# Patient Record
Sex: Male | Born: 2000
Health system: Southern US, Community
[De-identification: ages and names within clinical notes are randomized; demographics above are authoritative.]

## PROBLEM LIST (undated history)

## (undated) DIAGNOSIS — F419 Anxiety disorder, unspecified: Secondary | ICD-10-CM

## (undated) DIAGNOSIS — J45909 Unspecified asthma, uncomplicated: Secondary | ICD-10-CM

## (undated) DIAGNOSIS — M79A21 Nontraumatic compartment syndrome of right lower extremity: Secondary | ICD-10-CM

## (undated) DIAGNOSIS — R011 Cardiac murmur, unspecified: Secondary | ICD-10-CM

## (undated) HISTORY — DX: Nontraumatic compartment syndrome of right lower extremity: M79.A21

## (undated) HISTORY — DX: Unspecified asthma, uncomplicated: J45.909

## (undated) HISTORY — DX: Anxiety disorder, unspecified: F41.9

## (undated) HISTORY — DX: Cardiac murmur, unspecified: R01.1

---
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
## 2022-01-25 ENCOUNTER — Telehealth: Payer: Self-pay | Admitting: Family Medicine

## 2022-01-25 NOTE — Telephone Encounter (Signed)
Received immunization record put in folder

## 2022-01-31 ENCOUNTER — Encounter: Payer: Self-pay | Admitting: Family Medicine

## 2022-01-31 NOTE — Progress Notes (Unsigned)
No chief complaint on file.  Patient is a new patient, here to establish care. Records were received from his pediatrician and reviewed.  Immunizations UTD except did not see any HPV vaccines, or recent influenza or COVID booster. Last TdaP was 08/31/2013  Lipids were done 08/31/2013: TC 166, HDL 61, TG 204 (nonfasting), LDL 64, ratio 2.7.  (glu 119, nonfasting).

## 2022-02-01 ENCOUNTER — Ambulatory Visit: Payer: 59 | Admitting: Family Medicine

## 2022-02-01 ENCOUNTER — Encounter: Payer: Self-pay | Admitting: Family Medicine

## 2022-02-01 VITALS — BP 118/68 | HR 60 | Ht 71.0 in | Wt 161.8 lb

## 2022-02-01 DIAGNOSIS — F419 Anxiety disorder, unspecified: Secondary | ICD-10-CM | POA: Diagnosis not present

## 2022-02-01 DIAGNOSIS — Z77128 Contact with and (suspected) exposure to other hazards in the physical environment: Secondary | ICD-10-CM

## 2022-02-01 DIAGNOSIS — Z7689 Persons encountering health services in other specified circumstances: Secondary | ICD-10-CM

## 2022-02-01 DIAGNOSIS — Z7185 Encounter for immunization safety counseling: Secondary | ICD-10-CM

## 2022-02-01 NOTE — Patient Instructions (Addendum)
We discussed getting the HPV series.  Return for nurse visits when you can for this (series of 3 shots). Not given today due to scheduling/driving back to school.  Consider volunteering or checking out clubs at school to keep you more involved, and less free time to overthink things.  Continue to use the tools that you still remember from therapy to help with your anxiety. If CBD is going to be used, use it sparingly, and not daily.

## 2022-03-11 ENCOUNTER — Ambulatory Visit: Payer: 59 | Admitting: Nurse Practitioner

## 2022-03-30 ENCOUNTER — Other Ambulatory Visit (INDEPENDENT_AMBULATORY_CARE_PROVIDER_SITE_OTHER): Payer: 59

## 2022-03-30 DIAGNOSIS — Z23 Encounter for immunization: Secondary | ICD-10-CM

## 2022-05-23 ENCOUNTER — Encounter: Payer: Self-pay | Admitting: Family Medicine

## 2022-05-23 NOTE — Progress Notes (Unsigned)
Curtis Brewer is a 22 y.o. male who presents for a complete physical.   He has the following concerns:  He established care here in January. We discussed his h/o anxiety (therapy last in 2020). Infrequent panic attacks, able to ground himself and prevent them usually. He commented about feeling "stuck" at school, bored, mind wandering, overthinking things. Mind is calmest when he is busy and has a lot of work. GAD-7 score was 2, negative depression screen.   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  He is asking for his next renewal, if we can do a "basic medical" form, every 4 years, rather than the FAA medical (which requires special eval).  The basic eval can be performed by state licensed physcians.  Online forms reviewed on his phone, doesn't require anything special, just needs to be done by MD. (There are limitations to passengers, weight, etc compared to if he had full FAA medical exam).   H/o exposure to PCB's in [facility] which was shut down.  He denies any further respiratory issues.   Immunization History  Administered Date(s) Administered   DTaP 10/23/2002, 12/25/2002, 03/12/2003, 11/19/2003, 09/01/2006   HIB (PRP-OMP) 10/23/2002, 12/25/2002, 03/12/2003, 08/20/2003   HPV 9-valent 03/30/2022   Hepatitis A, Ped/Adol-2 Dose 09/01/2006, 09/06/2007   Hepatitis B, PED/ADOLESCENT 02-28-2002, 03/12/2003, 08/20/2003   IPV 10/23/2002, 12/25/2002, 11/19/2003, 09/01/2006   Influenza-Unspecified 10/04/2007, 11/30/2008, 11/13/2009, 12/22/2010, 11/28/2011, 11/14/2012, 01/05/2014, 11/29/2014, 11/15/2015, 11/21/2016, 11/12/2017, 10/14/2018, 12/02/2019, 01/12/2022   MMR 08/20/2003, 09/01/2006   Meningococcal B, OMV 09/14/2018, 08/29/2019   Meningococcal Conjugate 08/31/2013, 09/14/2018   PFIZER(Purple Top)SARS-COV-2 Vaccination 04/13/2019, 05/04/2019, 01/25/2020   Pneumococcal Conjugate-13 10/23/2002, 12/25/2002, 03/12/2003, 08/20/2003   Tdap 08/31/2013   Varicella 02/25/2004, 09/01/2006    Dentist: Ophtho: Exercise: Runs, gym  Sexual partners:   Lipids were done 08/31/2013: TC 166, HDL 61, TG 204 (nonfasting), LDL 64, ratio 2.7.  (glu 119, nonfasting).  PMH, PSH, SH and FH were reviewed and updated  ROS:  The patient denies anorexia, fever, weight changes, headaches,  vision loss, decreased hearing, ear pain, hoarseness, chest pain, palpitations, dizziness, syncope, dyspnea on exertion, cough, swelling, nausea, vomiting, diarrhea, constipation, abdominal pain, melena, hematochezia, indigestion/heartburn, hematuria, incontinence, erectile dysfunction, nocturia, weakened urine stream, dysuria, genital lesions, joint pains, numbness, tingling, weakness, tremor, suspicious skin lesions, depression, anxiety, abnormal bleeding/bruising, or enlarged lymph nodes   PHYSICAL EXAM:  There were no vitals taken for this visit.  Wt Readings from Last 3 Encounters:  02/01/22 161 lb 12.8 oz (73.4 kg) (62 %, Z= 0.30)*  11/22/16 180 lb (81.6 kg) (98 %, Z= 2.05)*  11/07/16 182 lb 5.1 oz (82.7 kg) (98 %, Z= 2.11)*   * Growth percentiles are based on CDC (Boys, 2-20 Years) data.    General Appearance:    Alert, cooperative, no distress, appears stated age  Head:    Normocephalic, without obvious abnormality, atraumatic  Eyes:    PERRL, conjunctiva/corneas clear, EOM's intact, fundi benign  Ears:    Normal TM's and external ear canals  Nose:   Nares normal, mucosa normal, no drainage or sinus   tenderness  Throat:   Lips, mucosa, and tongue normal; teeth and gums normal  Neck:   Supple, no lymphadenopathy;  thyroid:  no enlargement/ tenderness/nodules; no carotid bruit or JVD  Back:    Spine nontender, no curvature, ROM normal, no CVA     tenderness  Lungs:     Clear to auscultation bilaterally without wheezes, rales or     ronchi; respirations  unlabored  Chest Wall:    No tenderness or deformity   Heart:    Regular rate and rhythm, S1 and S2 normal, no murmur, rub or gallop  Breast  Exam:    No chest wall tenderness, masses or gynecomastia  Abdomen:     Soft, non-tender, nondistended, normoactive bowel sounds, no masses, no hepatosplenomegaly  Genitalia:    Normal male external genitalia without lesions.  Testicles without masses.  No inguinal hernias.  Rectal:   Deferred due to age <40 and lack of symptoms  Extremities:   No clubbing, cyanosis or edema  Pulses:   2+ and symmetric all extremities  Skin:   Skin color, texture, turgor normal, no rashes or lesions  Lymph nodes:   Cervical, supraclavicular, and axillary nodes normal  Neurologic:   CNII-XII intact, normal strength, sensation and gait; reflexes 2+ and symmetric throughout          Psych:   Normal mood, affect, hygiene and grooming.      ASSESSMENT/PLAN:  HPV #2 HPV #3 due after 09/30/22 Did he ever get any COVID booster (since 01/2020?)  Consider labs No need for STD if never sexually active  Recommended at least 30 minutes of aerobic activity at least 5 days/week, weight-bearing exercise at least 2x/week; discussed healthy diet and alcohol recommendations; regular seatbelt use, helmet use, proper sunscreen use; discussed safe sex, plan B;  changing batteries in smoke detectors, carbon monoxide detectors recommended. Self-testicular exams.  Immunization recommendations discussed--HPV #2 today, to return for [place] in Sept/Oct for #3. Flu shots recommended in the fall.  COVID booster recommended.  F/u 1 year CPE, sooner prn.

## 2022-05-24 ENCOUNTER — Encounter: Payer: Self-pay | Admitting: Family Medicine

## 2022-05-24 ENCOUNTER — Ambulatory Visit (INDEPENDENT_AMBULATORY_CARE_PROVIDER_SITE_OTHER): Payer: 59 | Admitting: Family Medicine

## 2022-05-24 VITALS — BP 120/68 | HR 68 | Ht 71.0 in | Wt 179.4 lb

## 2022-05-24 DIAGNOSIS — Z83438 Family history of other disorder of lipoprotein metabolism and other lipidemia: Secondary | ICD-10-CM | POA: Diagnosis not present

## 2022-05-24 DIAGNOSIS — Z0000 Encounter for general adult medical examination without abnormal findings: Secondary | ICD-10-CM

## 2022-05-24 DIAGNOSIS — M79669 Pain in unspecified lower leg: Secondary | ICD-10-CM

## 2022-05-24 DIAGNOSIS — R3129 Other microscopic hematuria: Secondary | ICD-10-CM | POA: Diagnosis not present

## 2022-05-24 DIAGNOSIS — R011 Cardiac murmur, unspecified: Secondary | ICD-10-CM

## 2022-05-24 DIAGNOSIS — Z23 Encounter for immunization: Secondary | ICD-10-CM

## 2022-05-24 LAB — POCT URINALYSIS DIP (PROADVANTAGE DEVICE)
Bilirubin, UA: NEGATIVE
Glucose, UA: NEGATIVE mg/dL
Ketones, POC UA: NEGATIVE mg/dL
Leukocytes, UA: NEGATIVE
Nitrite, UA: NEGATIVE
Protein Ur, POC: NEGATIVE mg/dL
Specific Gravity, Urine: 1.01
Urobilinogen, Ur: 0.2
pH, UA: 6 (ref 5.0–8.0)

## 2022-05-24 NOTE — Patient Instructions (Addendum)
I recommend getting a flu shot in the Fall (Sept/October). Consider a COVID booster. Return in mid September for your last HPV vaccine.  You will need a tetanus booster next summer.  See if you can get [place] records from your echocardiogram (evaluation for your heart murmur before you moved to [facility]).

## 2022-05-28 ENCOUNTER — Other Ambulatory Visit: Payer: 59

## 2022-05-28 DIAGNOSIS — Z83438 Family history of other disorder of lipoprotein metabolism and other lipidemia: Secondary | ICD-10-CM

## 2022-05-28 DIAGNOSIS — R3129 Other microscopic hematuria: Secondary | ICD-10-CM

## 2022-05-28 DIAGNOSIS — Z0000 Encounter for general adult medical examination without abnormal findings: Secondary | ICD-10-CM

## 2022-05-28 LAB — CBC WITH DIFFERENTIAL/PLATELET
Lymphocytes Absolute: 1.8 10*3/uL (ref 0.7–3.1)
Monocytes Absolute: 0.3 10*3/uL (ref 0.1–0.9)
Neutrophils Absolute: 2.5 10*3/uL (ref 1.4–7.0)
WBC: 4.6 10*3/uL (ref 3.4–10.8)

## 2022-05-28 LAB — COMPREHENSIVE METABOLIC PANEL
Chloride: 103 mmol/L (ref 96–106)
Sodium: 141 mmol/L (ref 134–144)

## 2022-05-28 LAB — LIPID PANEL

## 2022-05-28 LAB — VITAMIN D 25 HYDROXY (VIT D DEFICIENCY, FRACTURES)

## 2022-05-29 LAB — COMPREHENSIVE METABOLIC PANEL
ALT: 43 IU/L (ref 0–44)
AST: 31 IU/L (ref 0–40)
Albumin: 4.6 g/dL (ref 4.3–5.2)
BUN: 18 mg/dL (ref 6–20)
CO2: 24 mmol/L (ref 20–29)
Creatinine, Ser: 0.93 mg/dL (ref 0.76–1.27)
Glucose: 93 mg/dL (ref 70–99)
Potassium: 4.9 mmol/L (ref 3.5–5.2)
Total Protein: 6.5 g/dL (ref 6.0–8.5)
eGFR: 121 mL/min/{1.73_m2} (ref >59)

## 2022-05-29 LAB — CBC WITH DIFFERENTIAL/PLATELET
Basophils Absolute: 0 10*3/uL (ref 0.0–0.2)
Basos: 1 %
EOS (ABSOLUTE): 0.1 10*3/uL (ref 0.0–0.4)
Eos: 1 %
Hematocrit: 42.9 % (ref 37.5–51.0)
Hemoglobin: 14.7 g/dL (ref 13.0–17.7)
Immature Grans (Abs): 0 10*3/uL (ref 0.0–0.1)
Immature Granulocytes: 0 %
Lymphs: 38 %
MCH: 30.6 pg (ref 26.6–33.0)
MCHC: 34.3 g/dL (ref 31.5–35.7)
MCV: 89 fL (ref 79–97)
Monocytes: 7 %
Neutrophils: 53 %
Platelets: 246 10*3/uL (ref 150–450)
RBC: 4.8 x10E6/uL (ref 4.14–5.80)
RDW: 11.9 % (ref 11.6–15.4)

## 2022-05-29 LAB — LIPID PANEL: VLDL Cholesterol Cal: 11 mg/dL (ref 5–40)

## 2022-07-05 HISTORY — PX: FASCIOTOMY: SHX132

## 2022-09-03 ENCOUNTER — Encounter: Payer: Self-pay | Admitting: Family Medicine

## 2022-10-08 ENCOUNTER — Other Ambulatory Visit (INDEPENDENT_AMBULATORY_CARE_PROVIDER_SITE_OTHER): Payer: 59

## 2022-10-08 DIAGNOSIS — Z23 Encounter for immunization: Secondary | ICD-10-CM

## 2022-11-11 ENCOUNTER — Encounter: Payer: Self-pay | Admitting: Family Medicine

## 2022-11-11 ENCOUNTER — Encounter: Payer: Self-pay | Admitting: *Deleted

## 2022-11-11 ENCOUNTER — Ambulatory Visit: Payer: 59 | Admitting: Family Medicine

## 2022-11-11 VITALS — BP 128/70 | HR 64 | Ht 71.0 in | Wt 176.6 lb

## 2022-11-11 DIAGNOSIS — Z711 Person with feared health complaint in whom no diagnosis is made: Secondary | ICD-10-CM

## 2022-11-11 NOTE — Progress Notes (Signed)
Chief Complaint  Patient presents with   skin issue    Has a place at jawline on left that has been there for about a month. Last week got very dark in color, almost black-has lightened this week. Not painful or itchy. Has only used his regular moiturizer (Native brand w/a drop of Tea Tree Oil) nothing else.     Patient noted a discoloration at his L jawline about a month ago.  He thought it could have related to acne/scarring. It got more raised, then he picked at it.  It bled a lot after this.  The skin was more swollen/thicker. The color then changed to a much darker blackish color, so he made an appointment. Photo on phone shows a raised, dark, bluish-black lesion. Since then, it has lightened in color, not as thick/raised.  It is not currently itchy or painful. He doesn't typically shave.  PMH, PSH, SH reviewed  Outpatient Encounter Medications as of 11/11/2022  Medication Sig Note   Omega-3 Fatty Acids (FISH OIL PO) Take 3 capsules by mouth daily. 02/01/2022: DE3   ibuprofen (ADVIL,MOTRIN) 600 MG tablet Take 1 tablet (600 mg total) by mouth every 6 (six) hours as needed for mild pain. (Patient not taking: Reported on 11/22/2016) 11/11/2022: As needed   No facility-administered encounter medications on file as of 11/11/2022.   No Known Allergies  ROS: no f/c, n/v/d. No other skin rashes or concerns. He healed well from fasciotomy and has been training for 1/2 marathon in [place] next week.    PHYSICAL EXAM:  BP 128/70   Pulse 64   Ht 5\' 11"  (1.803 m)   Wt 176 lb 9.6 oz (80.1 kg)   BMI 24.63 kg/m  Wt Readings from Last 3 Encounters:  11/11/22 176 lb 9.6 oz (80.1 kg)  05/24/22 179 lb 6.4 oz (81.4 kg) (80%, Z= 0.85)*  02/01/22 161 lb 12.8 oz (73.4 kg) (62%, Z= 0.30)*   * Growth percentiles are based on CDC (Boys, 2-20 Years) data.    Well-appearing, pleasant male, in no distress HEENT: conjunctiva and sclera are clear, EOMI Skin: +facial hair.  At angle of L jaw there is  a faintly pink area that is flat, with only minimal thickening of the skin below this. 10 x 2-3 mm in size.  Nontender.  No pigmentation to this lesion. Neck: no lymphadenopathy or mass Extremities: no edema. WHSS bilateral medial and lateral mid-calf area.   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 He is doing well. Declined COVID booster. Already got flu shot.

## 2023-04-20 ENCOUNTER — Encounter: Payer: Self-pay | Admitting: Family Medicine

## 2023-04-27 NOTE — Progress Notes (Unsigned)
 No chief complaint on file.  Patient sent the following message on 04/20/23: "I started seeing a therapist a couple months ago for anxiety and OCD traits and we think it would be a good idea for me to try an SSRI to further help with these. Should I schedule an appointment or e-visit with you to discuss my options? 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      ASSESSMENT/PLAN:  Gad-7 Phq-9

## 2023-04-28 ENCOUNTER — Ambulatory Visit: Admitting: Family Medicine

## 2023-04-28 ENCOUNTER — Encounter: Payer: Self-pay | Admitting: Family Medicine

## 2023-04-28 VITALS — BP 130/70 | HR 76 | Ht 71.0 in | Wt 176.0 lb

## 2023-04-28 DIAGNOSIS — F411 Generalized anxiety disorder: Secondary | ICD-10-CM | POA: Diagnosis not present

## 2023-04-28 MED ORDER — FLUOXETINE HCL 10 MG PO TABS: ORAL_TABLET | ORAL | 1 refills | Status: DC

## 2023-04-28 NOTE — Patient Instructions (Addendum)
 We are stating the fluoxetine at a very low dose in order to not increase anxiety or trigger any panic attacks, since you cannot take any alprazolam.  The tablets cost more than the capsules, but we will eventually switch you to the capsules once we know what dose is working best, and not need to cut the dose.  Start at 1/2 tablet for 3-4 days. If you have ongoing side effects, you can stay at the half dose for longer. Ultimately try and get to the full tablet. You can stay at the 10 mg dose for 4-6 weeks. You can contact us in 2-4 weeks if you have no adverse effects, but see no benefit. In that case you can further titrate the dose up--to 15 mg (1.5 tablets), and ultimately up to 20 mg, and we would then change to a 20 mg capsule.  Continue with the counseling.  I'm glad to see that has had significant effect so far already.  Schedule a 6 week follow-up after you start the medications (soon, or if you don't tolerate the first dose or two, and you decide to wait until after exams).

## 2023-05-12 ENCOUNTER — Encounter: Payer: Self-pay | Admitting: Family Medicine

## 2023-05-30 NOTE — Progress Notes (Unsigned)
 No chief complaint on file.   Curtis Brewer is a 23 y.o. male who presents for a complete physical.   He has the following concerns:  Anxiety follow-up: He was started on fluoxetine  last month. He continues to get therapy.  Prior to treatments, he reported restlessness, excessive worry, trouble focusing, irritability (especially with roommates), and some sense of doom. Since therapy, he is better at recognizing when things are irrational, and using coping strategies he learned. GAD-7 was 7 prior to starting fluoxetine .  We started it at a very low dose, due to him not being allowed to take any benzodiazepines, concern for triggering increased anxiety or panic attacks.  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 Classes were hard, but wasn't skipping classes, was doing the work.  Still getting 8-10 hours of sleep, occ just 7. Only rarely gets less (stays up too late).   Appetite is good. Pescaterian now. Less active now--hasn't been running regularly. (Very active in the fall--ran 2 half marathons, ran 5 miler with the flu. Did a 3 hour ultra with bronchitis (16+ miles). Started rock climbing. Occasionally has used CBD recreationally.    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  He previously asked that for his next renewal, if we can do a "basic medical" form, every 4 years, rather than the FAA medical (which requires special eval).  The basic eval can be performed by state licensed physcians.  Online forms reviewed on his phone last year, doesn't require anything special, just needs to be done by MD.  (There are limitations to passengers, weight, etc compared to if he had full FAA medical exam).    Immunization History  Administered Date(s) Administered   DTaP 10/23/2002, 12/25/2002, 03/12/2003, 11/19/2003, 09/01/2006   HIB (PRP-OMP) 10/23/2002, 12/25/2002, 03/12/2003, 08/20/2003   HPV 9-valent 03/30/2022,  05/24/2022, 10/08/2022   Hepatitis A, Ped/Adol-2 Dose 09/01/2006, 09/06/2007   Hepatitis B, PED/ADOLESCENT 04-19-02, 03/12/2003, 08/20/2003   IPV 10/23/2002, 12/25/2002, 11/19/2003, 09/01/2006   Influenza, Seasonal, Injecte, Preservative Fre 10/08/2022   Influenza-Unspecified 10/04/2007, 11/30/2008, 11/13/2009, 12/22/2010, 11/28/2011, 11/14/2012, 01/05/2014, 11/29/2014, 11/15/2015, 11/21/2016, 11/12/2017, 10/14/2018, 12/02/2019, 01/12/2022   MMR 08/20/2003, 09/01/2006   Meningococcal B, OMV 09/14/2018, 08/29/2019   Meningococcal Conjugate 08/31/2013, 09/14/2018   PFIZER(Purple Top)SARS-COV-2 Vaccination 04/13/2019, 05/04/2019, 01/25/2020   Pneumococcal Conjugate-13 10/23/2002, 12/25/2002, 03/12/2003, 08/20/2003   Tdap 08/31/2013   Varicella 02/25/2004, 09/01/2006   Dentist: once a year Ophtho: yearly (wears contacts) Exercise:  Weight lifting (1.5-2 hours/day at [employer]??  Fall 2024--ran 2 half marathons, ran [employer] with the flu. Did a 3 hour ultra with bronchitis (16+ miles).    Sexual partners: None  05/2022: normal cbc, c-met. Vitamin D -OH 37.2  Lipids: Lab Results  Component Value Date   CHOL 147 05/28/2022   HDL 80 05/28/2022   LDLCALC 56 05/28/2022   TRIG 47 05/28/2022   CHOLHDL 1.8 05/28/2022     PMH, PSH, SH and FH were reviewed and updated Takes fish oil for dry eyes (recommended by eye doctor).     ROS:  The patient denies anorexia, fever, headaches,  vision loss, decreased hearing, ear pain, hoarseness, chest pain, palpitations, dizziness, syncope, dyspnea on exertion, cough, swelling, nausea, vomiting, diarrhea, constipation, abdominal pain, melena, hematochezia, indigestion/heartburn, hematuria, incontinence, erectile dysfunction, nocturia, weakened urine stream, dysuria, genital lesions, joint pains, numbness, tingling, weakness, tremor, suspicious skin lesions, depression, abnormal bleeding/bruising, or enlarged lymph  nodes  Anxiety  PHYSICAL EXAM:  There were no vitals taken for this visit.  Wt Readings from Last 3 Encounters:  04/28/23 176 lb (79.8 kg)  11/11/22 176 lb 9.6 oz (80.1 kg)  05/24/22 179 lb 6.4 oz (81.4 kg) (80%, Z= 0.85)*   * Growth percentiles are based on CDC (Boys, 2-20 Years) data.    General Appearance:    Alert, cooperative, no distress, appears stated age  Head:    Normocephalic, without obvious abnormality, atraumatic  Eyes:    PERRL, conjunctiva/corneas clear, EOM's intact, fundi benign  Ears:    Normal TM's and external ear canals  Nose:   Nares normal, mucosa normal, no drainage or sinus   tenderness  Throat:   Lips, mucosa, and tongue normal; teeth and gums normal  Neck:   Supple, no lymphadenopathy;  thyroid:  no enlargement/ tenderness/nodules; no carotid bruit or JVD  Back:    Spine nontender, no curvature, ROM normal, no CVA     tenderness  Lungs:     Clear to auscultation bilaterally without wheezes, rales or     ronchi; respirations unlabored  Chest Wall:    No tenderness or deformity   Heart:    Regular rate and rhythm, S1 and S2 normal. Murmur noted at RUSB. No rub or gallop  Breast Exam:    No chest wall tenderness, masses or gynecomastia  Abdomen:     Soft, non-tender, nondistended, normoactive bowel sounds, no masses, no hepatosplenomegaly  Genitalia:    Normal male external genitalia without lesions.  Testicles without masses.  No inguinal hernias.  Rectal:   Deferred due to age <40 and lack of symptoms  Extremities:   No clubbing, cyanosis or edema. Nontender along anterior tibia and ant tibialis muscles bilaterally  Pulses:   2+ and symmetric all extremities  Skin:   Skin color, texture, turgor normal, no rashes or lesions  Lymph nodes:   Cervical, supraclavicular, and axillary nodes normal  Neurologic:   CNII-XII intact, normal strength, sensation and gait; reflexes 2+ and symmetric throughout          Psych:   Normal mood, affect, hygiene and  grooming.       ASSESSMENT/PLAN:  GAD-7, phq Did he bring basic medical form for pilot's license? We said we would do this for him this year (current one is good until 07/2023 I believe)  TdaP   No labs needed (unless sx or needed for form). Unless has sexual partner, then needs STD testing.   Recommended at least 30 minutes of aerobic activity at least 5 days/week, weight-bearing exercise at least 2x/week; discussed healthy diet and alcohol recommendations; regular seatbelt use, helmet use, proper sunscreen use; discussed safe sex, plan B;  changing batteries in smoke detectors, carbon monoxide detectors recommended.  Immunization recommendations discussed--Flu shots recommended in the fall.  COVID booster recommended. TdaP given today.  F/u 1 year CPE, sooner prn.

## 2023-06-01 ENCOUNTER — Encounter: Payer: Self-pay | Admitting: Family Medicine

## 2023-06-01 ENCOUNTER — Ambulatory Visit (INDEPENDENT_AMBULATORY_CARE_PROVIDER_SITE_OTHER): Payer: 59 | Admitting: Family Medicine

## 2023-06-01 VITALS — BP 128/70 | HR 68 | Ht 71.5 in | Wt 171.6 lb

## 2023-06-01 DIAGNOSIS — Z0000 Encounter for general adult medical examination without abnormal findings: Secondary | ICD-10-CM | POA: Diagnosis not present

## 2023-06-01 DIAGNOSIS — F411 Generalized anxiety disorder: Secondary | ICD-10-CM

## 2023-06-01 DIAGNOSIS — R140 Abdominal distension (gaseous): Secondary | ICD-10-CM | POA: Diagnosis not present

## 2023-06-01 DIAGNOSIS — Z23 Encounter for immunization: Secondary | ICD-10-CM | POA: Diagnosis not present

## 2023-06-01 DIAGNOSIS — F419 Anxiety disorder, unspecified: Secondary | ICD-10-CM

## 2023-06-01 DIAGNOSIS — Z1159 Encounter for screening for other viral diseases: Secondary | ICD-10-CM | POA: Diagnosis not present

## 2023-06-01 DIAGNOSIS — Z113 Encounter for screening for infections with a predominantly sexual mode of transmission: Secondary | ICD-10-CM

## 2023-06-01 MED ORDER — FLUOXETINE HCL 20 MG PO CAPS: 20.0000 mg | ORAL_CAPSULE | Freq: Every day | ORAL | 0 refills | Status: DC

## 2023-06-01 NOTE — Patient Instructions (Addendum)
 We discussed increasing the fluoxetine  dose to 15 mg (1.5 tablets daily). Take this for a week (can be longer if having any side effects). If you are tolerating the 1.5 tablets well, you can try taking 2 pills together (20 mg dose). If you tolerate this, when you run out, pick up the 20 mg capsule of the prescription sent today. If you cannot tolerate 20 mg together, you can continue the 15 mg, and let us  know that you need a refill of the tablet at the larger quantity, to last for a full month (not #30).  You can use simethicone (Gas-X) as needed for bloating and pain. If you can identify triggering foods, you can either avoid them, or take Bean-O prior to eating them. Try and pay attention to see if you can find a pattern as far as which foods/snacks/drinks may contribute to your symptoms.

## 2023-06-02 ENCOUNTER — Ambulatory Visit: Payer: Self-pay | Admitting: Family Medicine

## 2023-06-03 LAB — GC/CHLAMYDIA PROBE AMP
Chlamydia trachomatis, NAA: NEGATIVE
Neisseria Gonorrhoeae by PCR: NEGATIVE

## 2023-06-03 LAB — HIV ANTIBODY (ROUTINE TESTING W REFLEX)

## 2023-06-03 LAB — HEPATITIS C ANTIBODY: Hep C Virus Ab: NONREACTIVE

## 2023-06-03 LAB — RPR: RPR Ser Ql: NONREACTIVE

## 2023-06-03 LAB — HEPATITIS B SURFACE ANTIGEN: Hepatitis B Surface Ag: NEGATIVE

## 2023-06-03 LAB — HEPATITIS B SURFACE ANTIBODY, QUANTITATIVE: Hepatitis B Surf Ab Quant: <3.5 m[IU]/mL — ABNORMAL LOW

## 2023-06-20 ENCOUNTER — Other Ambulatory Visit (INDEPENDENT_AMBULATORY_CARE_PROVIDER_SITE_OTHER)

## 2023-06-20 DIAGNOSIS — Z23 Encounter for immunization: Secondary | ICD-10-CM

## 2023-07-20 ENCOUNTER — Other Ambulatory Visit (INDEPENDENT_AMBULATORY_CARE_PROVIDER_SITE_OTHER)

## 2023-07-20 DIAGNOSIS — Z23 Encounter for immunization: Secondary | ICD-10-CM | POA: Diagnosis not present

## 2023-08-31 NOTE — Progress Notes (Signed)
 Start time: 4:07 End time: 4:23  Virtual Visit via Video Note  I connected with Curtis Brewer on 09/01/23 by a video enabled telemedicine application and verified that I am speaking with the correct person using two identifiers.  Location: Patient: girlfriend's house Provider: office   I discussed the limitations of evaluation and management by telemedicine and the availability of in person appointments. The patient expressed understanding and agreed to proceed.  History of Present Illness:  Chief Complaint  Patient presents with   Anxiety   Medication Management   Patient presents for 3 month f/u on anxiety. Last discussed in June, at which time we encouraged him to slowly titrate the dose up to 20 mg of fluoxetine .  Fluoxetine  was started in 04/2023 (starting at 5 mg, titrating up to 10mg ).  He was on 10 mg dose at June visit, and reported the world is quieter--impending doom was better, no longer having a voice in his head telling him he'll die.    Today he reports doing very well with the 20 mg capsules.  I feel much better, I feel great. He crashes hard at night, but not feeling tired during the day, no insomnia. No sexual side effects.  He continues to see therapist, every few weeks.  He is a Chief Strategy Officer at [employer], TEFL teacher. Classes start next week. 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 He will be working 15 hours a week, but can cut back if needed.  He continues to sleep well. Appetite is good. He is now vegetarian, no longer pescatarian. He has been working out.  He has been biking, mountain climbing.  Plans to resume lifting back at the gym      Observations/Objective:  Wt 175 lb (79.4 kg) Comment: home scale  BMI 24.07 kg/m   Well-appearing male, appears relaxed, in no distress He is alert and oriented. Cranial nerves grossly intact. Normal mood, affect, grooming. Normal eye  contactand speech.     09/01/2023    4:17 PM 06/01/2023    2:25 PM 04/28/2023    3:34 PM 02/01/2022   11:30 AM  GAD 7 : Generalized Anxiety Score  Nervous, Anxious, on Edge 1 1 2 1   Control/stop worrying 0 1 1 0  Worry too much - different things 0 1 1 1   Trouble relaxing 0 0 1 0  Restless 1 1 0 0  Easily annoyed or irritable 0 0 1 0  Afraid - awful might happen 0 1 1 0  Total GAD 7 Score 2 5 7 2   Anxiety Difficulty  Not difficult at all Not difficult at all Not difficult at all     Assessment and Plan:   Generalized anxiety disorder - Well controlled with 20 mg fluoxetine . Cont counseling, regular exercise, healthy diet. - Plan: FLUoxetine  (PROZAC ) 20 MG capsule   F/u 06/2024 for CPE, sooner if needed.   Follow Up Instructions:     I discussed the assessment and treatment plan with the patient. The patient was provided an opportunity to ask questions and all were answered. The patient agreed with the plan and demonstrated an understanding of the instructions.   The patient was advised to call back or seek an in-person evaluation if the symptoms worsen or if the condition fails to improve as anticipated.  I spent 20 minutes dedicated to the care of this patient, including pre-visit review of records, face to face time,  post-visit ordering of testing and documentation.    Annabelle DELENA Fetters, MD

## 2023-09-01 ENCOUNTER — Telehealth: Admitting: Family Medicine

## 2023-09-01 ENCOUNTER — Encounter: Payer: Self-pay | Admitting: Family Medicine

## 2023-09-01 DIAGNOSIS — F411 Generalized anxiety disorder: Secondary | ICD-10-CM | POA: Diagnosis not present

## 2023-09-01 MED ORDER — FLUOXETINE HCL 20 MG PO CAPS: 20.0000 mg | ORAL_CAPSULE | Freq: Every day | ORAL | 3 refills | Status: AC

## 2024-06-21 ENCOUNTER — Encounter: Payer: Self-pay | Admitting: Family Medicine
